# Patient Record
Sex: Male | Born: 1985 | Race: Black or African American | Hispanic: No | Marital: Single | Smoking: Never smoker
Health system: Southern US, Community
[De-identification: ages and names within clinical notes are randomized; demographics above are authoritative.]

---

## 2015-08-28 ENCOUNTER — Emergency Department (HOSPITAL_COMMUNITY)
Admission: EM | Admit: 2015-08-28 | Discharge: 2015-08-28 | Disposition: A | Discharge status: Home / Self Care | Payer: Self-pay | Attending: Emergency Medicine | Admitting: Emergency Medicine

## 2015-08-28 ENCOUNTER — Encounter (HOSPITAL_COMMUNITY): Payer: Self-pay | Admitting: *Deleted

## 2015-08-28 DIAGNOSIS — M5489 Other dorsalgia: Secondary | ICD-10-CM

## 2015-08-28 DIAGNOSIS — Y92410 Unspecified street and highway as the place of occurrence of the external cause: Secondary | ICD-10-CM | POA: Insufficient documentation

## 2015-08-28 DIAGNOSIS — S4991XA Unspecified injury of right shoulder and upper arm, initial encounter: Secondary | ICD-10-CM | POA: Insufficient documentation

## 2015-08-28 DIAGNOSIS — S3992XA Unspecified injury of lower back, initial encounter: Secondary | ICD-10-CM | POA: Insufficient documentation

## 2015-08-28 DIAGNOSIS — Y9389 Activity, other specified: Secondary | ICD-10-CM | POA: Insufficient documentation

## 2015-08-28 DIAGNOSIS — S299XXA Unspecified injury of thorax, initial encounter: Secondary | ICD-10-CM | POA: Insufficient documentation

## 2015-08-28 DIAGNOSIS — S199XXA Unspecified injury of neck, initial encounter: Secondary | ICD-10-CM | POA: Insufficient documentation

## 2015-08-28 DIAGNOSIS — Y998 Other external cause status: Secondary | ICD-10-CM | POA: Insufficient documentation

## 2015-08-28 DIAGNOSIS — S4992XA Unspecified injury of left shoulder and upper arm, initial encounter: Secondary | ICD-10-CM | POA: Insufficient documentation

## 2015-08-28 MED ORDER — IBUPROFEN 800 MG PO TABS: 800.0000 mg | ORAL_TABLET | Freq: Three times a day (TID) | ORAL | Status: AC

## 2015-08-28 MED ORDER — METHOCARBAMOL 500 MG PO TABS: 500.0000 mg | ORAL_TABLET | Freq: Two times a day (BID) | ORAL | Status: AC

## 2015-08-28 NOTE — ED Notes (Signed)
Pt stable, ambulatory, states understanding of discharge instructions 
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
## 2015-08-28 NOTE — Discharge Instructions (Signed)
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
## 2015-09-01 NOTE — ED Notes (Signed)
Pt came to ED, needs work note to return to work. Nurse printed work note.

## 2015-09-08 ENCOUNTER — Emergency Department (HOSPITAL_COMMUNITY)
Admission: EM | Admit: 2015-09-08 | Discharge: 2015-09-08 | Disposition: A | Discharge status: Home / Self Care | Payer: Self-pay | Attending: Emergency Medicine | Admitting: Emergency Medicine

## 2015-09-08 ENCOUNTER — Encounter (HOSPITAL_COMMUNITY): Payer: Self-pay | Admitting: Emergency Medicine

## 2015-09-08 ENCOUNTER — Emergency Department (HOSPITAL_COMMUNITY): Payer: Self-pay

## 2015-09-08 DIAGNOSIS — M542 Cervicalgia: Secondary | ICD-10-CM | POA: Insufficient documentation

## 2015-09-08 DIAGNOSIS — M545 Low back pain: Secondary | ICD-10-CM | POA: Insufficient documentation

## 2015-09-08 MED ORDER — MELOXICAM 7.5 MG PO TABS: 7.5000 mg | ORAL_TABLET | Freq: Every day | ORAL | Status: AC
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
## 2015-09-08 NOTE — Discharge Instructions (Signed)
Cervical Sprain °A cervical sprain is an injury in the neck in which the strong, fibrous tissues (ligaments) that connect your neck bones stretch or tear. Cervical sprains can range from mild to severe. Severe cervical sprains can cause the neck vertebrae to be unstable. This can lead to damage of the spinal cord and can result in serious nervous system problems. The amount of time it takes for a cervical sprain to get better depends on the cause and extent of the injury. Most cervical sprains heal in 1 to 3 weeks. °CAUSES  °Severe cervical sprains may be caused by:  °· Contact sport injuries (such as from football, rugby, wrestling, hockey, auto racing, gymnastics, diving, martial arts, or boxing).   °· Motor vehicle collisions.   °· Whiplash injuries. This is an injury from a sudden forward and backward whipping movement of the head and neck.  °· Falls.   °Mild cervical sprains may be caused by:  °· Being in an awkward position, such as while cradling a telephone between your ear and shoulder.   °· Sitting in a chair that does not offer proper support.   °· Working at a poorly designed computer station.   °· Looking up or down for long periods of time.   °SYMPTOMS  °· Pain, soreness, stiffness, or a burning sensation in the front, back, or sides of the neck. This discomfort may develop immediately after the injury or slowly, 24 hours or more after the injury.   °· Pain or tenderness directly in the middle of the back of the neck.   °· Shoulder or upper back pain.   °· Limited ability to move the neck.   °· Headache.   °· Dizziness.   °· Weakness, numbness, or tingling in the hands or arms.   °· Muscle spasms.   °· Difficulty swallowing or chewing.   °· Tenderness and swelling of the neck.   °DIAGNOSIS  °Most of the time your health care provider can diagnose a cervical sprain by taking your history and doing a physical exam. Your health care provider will ask about previous neck injuries and any known neck  problems, such as arthritis in the neck. X-rays may be taken to find out if there are any other problems, such as with the bones of the neck. Other tests, such as a CT scan or MRI, may also be needed.  °TREATMENT  °Treatment depends on the severity of the cervical sprain. Mild sprains can be treated with rest, keeping the neck in place (immobilization), and pain medicines. Severe cervical sprains are immediately immobilized. Further treatment is done to help with pain, muscle spasms, and other symptoms and may include: °· Medicines, such as pain relievers, numbing medicines, or muscle relaxants.   °· Physical therapy. This may involve stretching exercises, strengthening exercises, and posture training. Exercises and improved posture can help stabilize the neck, strengthen muscles, and help stop symptoms from returning.   °HOME CARE INSTRUCTIONS  °· Put ice on the injured area.   °¨ Put ice in a plastic bag.   °¨ Place a towel between your skin and the bag.   °¨ Leave the ice on for 15-20 minutes, 3-4 times a day.   °· If your injury was severe, you may have been given a cervical collar to wear. A cervical collar is a two-piece collar designed to keep your neck from moving while it heals. °¨ Do not remove the collar unless instructed by your health care provider. °¨ If you have long hair, keep it outside of the collar. °¨ Ask your health care provider before making any adjustments to your collar. Minor   adjustments may be required over time to improve comfort and reduce pressure on your chin or on the back of your head.  Ifyou are allowed to remove the collar for cleaning or bathing, follow your health care provider's instructions on how to do so safely.  Keep your collar clean by wiping it with mild soap and water and drying it completely. If the collar you have been given includes removable pads, remove them every 1-2 days and hand wash them with soap and water. Allow them to air dry. They should be completely  dry before you wear them in the collar.  If you are allowed to remove the collar for cleaning and bathing, wash and dry the skin of your neck. Check your skin for irritation or sores. If you see any, tell your health care provider.  Do not drive while wearing the collar.   Only take over-the-counter or prescription medicines for pain, discomfort, or fever as directed by your health care provider.   Keep all follow-up appointments as directed by your health care provider.   Keep all physical therapy appointments as directed by your health care provider.   Make any needed adjustments to your workstation to promote good posture.   Avoid positions and activities that make your symptoms worse.   Warm up and stretch before being active to help prevent problems.  SEEK MEDICAL CARE IF:   Your pain is not controlled with medicine.   You are unable to decrease your pain medicine over time as planned.   Your activity level is not improving as expected.  SEEK IMMEDIATE MEDICAL CARE IF:   You develop any bleeding.  You develop stomach upset.  You have signs of an allergic reaction to your medicine.   Your symptoms get worse.   You develop new, unexplained symptoms.   You have numbness, tingling, weakness, or paralysis in any part of your body.  MAKE SURE YOU:   Understand these instructions.  Will watch your condition.  Will get help right away if you are not doing well or get worse. Document Released: 10/09/2007 Document Revised: 12/17/2013 Document Reviewed: 06/19/2013 [facility] Patient Information 2015 [place], [place]. This information is not intended to replace advice given to you by your health care provider. Make sure you discuss any questions you have with your health care provider. Back Pain, Adult Low back pain is very common. About 1 in 5 people have back pain.The cause of low back pain is rarely dangerous. The pain often gets better over time.About half of  people with a sudden onset of back pain feel better in just 2 weeks. About 8 in 10 people feel better by 6 weeks.  CAUSES Some common causes of back pain include:  Strain of the muscles or ligaments supporting the spine.  Wear and tear (degeneration) of the spinal discs.  Arthritis.  Direct injury to the back. DIAGNOSIS Most of the time, the direct cause of low back pain is not known.However, back pain can be treated effectively even when the exact cause of the pain is unknown.Answering your caregiver's questions about your overall health and symptoms is one of the most accurate ways to make sure the cause of your pain is not dangerous. If your caregiver needs more information, he or she may order lab work or imaging tests (X-rays or MRIs).However, even if imaging tests show changes in your back, this usually does not require surgery. HOME CARE INSTRUCTIONS For many people, back pain returns.Since low back pain is  rarely dangerous, it is often a condition that people can learn to [facility] their own.   Remain active. It is stressful on the back to sit or stand in one place. Do not sit, drive, or stand in one place for more than 30 minutes at a time. Take short walks on level surfaces as soon as pain allows.Try to increase the length of time you walk each day.  Do not stay in bed.Resting more than 1 or 2 days can delay your recovery.  Do not avoid exercise or work.Your body is made to move.It is not dangerous to be active, even though your back may hurt.Your back will likely heal faster if you return to being active before your pain is gone.  Pay attention to your body when you bend and lift. Many people have less discomfortwhen lifting if they bend their knees, keep the load close to their bodies,and avoid twisting. Often, the most comfortable positions are those that put less stress on your recovering back.  Find a comfortable position to sleep. Use a firm mattress and lie on  your side with your knees slightly bent. If you lie on your back, put a pillow under your knees.  Only take over-the-counter or prescription medicines as directed by your caregiver. Over-the-counter medicines to reduce pain and inflammation are often the most helpful.Your caregiver may prescribe muscle relaxant drugs.These medicines help dull your pain so you can more quickly return to your normal activities and healthy exercise.  Put ice on the injured area.  Put ice in a plastic bag.  Place a towel between your skin and the bag.  Leave the ice on for 15-20 minutes, 03-04 times a day for the first 2 to 3 days. After that, ice and heat may be alternated to reduce pain and spasms.  Ask your caregiver about trying back exercises and gentle massage. This may be of some benefit.  Avoid feeling anxious or stressed.Stress increases muscle tension and can worsen back pain.It is important to recognize when you are anxious or stressed and learn ways to manage it.Exercise is a great option. SEEK MEDICAL CARE IF:  You have pain that is not relieved with rest or medicine.  You have pain that does not improve in 1 week.  You have new symptoms.  You are generally not feeling well. SEEK IMMEDIATE MEDICAL CARE IF:   You have pain that radiates from your back into your legs.  You develop new bowel or bladder control problems.  You have unusual weakness or numbness in your arms or legs.  You develop nausea or vomiting.  You develop abdominal pain.  You feel faint. Document Released: 12/12/2005 Document Revised: 06/12/2012 Document Reviewed: 04/15/2014 [facility] Patient Information 2015 [place], [place]. This information is not intended to replace advice given to you by your health care provider. Make sure you discuss any questions you have with your health care provider.

## 2015-09-08 NOTE — ED Notes (Signed)
Pt ambulatory without any problems 
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
## 2017-12-12 ENCOUNTER — Encounter (HOSPITAL_BASED_OUTPATIENT_CLINIC_OR_DEPARTMENT_OTHER): Payer: Self-pay | Admitting: Emergency Medicine

## 2017-12-12 ENCOUNTER — Emergency Department (HOSPITAL_BASED_OUTPATIENT_CLINIC_OR_DEPARTMENT_OTHER)
Admission: EM | Admit: 2017-12-12 | Discharge: 2017-12-12 | Disposition: A | Discharge status: Home / Self Care | Payer: Self-pay | Attending: Emergency Medicine | Admitting: Emergency Medicine

## 2017-12-12 ENCOUNTER — Other Ambulatory Visit: Payer: Self-pay

## 2017-12-12 DIAGNOSIS — Z791 Long term (current) use of non-steroidal anti-inflammatories (NSAID): Secondary | ICD-10-CM | POA: Insufficient documentation

## 2017-12-12 DIAGNOSIS — Z79899 Other long term (current) drug therapy: Secondary | ICD-10-CM | POA: Insufficient documentation

## 2017-12-12 DIAGNOSIS — M546 Pain in thoracic spine: Secondary | ICD-10-CM | POA: Insufficient documentation

## 2017-12-12 LAB — URINALYSIS, ROUTINE W REFLEX MICROSCOPIC
BILIRUBIN URINE: NEGATIVE
Glucose, UA: NEGATIVE mg/dL
Hgb urine dipstick: NEGATIVE
Ketones, ur: NEGATIVE mg/dL
LEUKOCYTES UA: NEGATIVE
NITRITE: NEGATIVE
PH: 7 (ref 5.0–8.0)
Protein, ur: NEGATIVE mg/dL
SPECIFIC GRAVITY, URINE: 1.025 (ref 1.005–1.030)

## 2017-12-12 MED ORDER — CYCLOBENZAPRINE HCL 5 MG PO TABS: 5.0000 mg | ORAL_TABLET | Freq: Every evening | ORAL | 0 refills | Status: AC | PRN

## 2017-12-12 MED ORDER — NAPROXEN 500 MG PO TABS: 500.0000 mg | ORAL_TABLET | Freq: Two times a day (BID) | ORAL | 0 refills | Status: AC

## 2017-12-12 NOTE — ED Triage Notes (Signed)
Patient states that he has had back pain since " this summer" - today it hurt worse and he was unable to go work because of the pain

## 2017-12-12 NOTE — Discharge Instructions (Signed)
Take naproxen 2 times a day with meals for the next 7 days.  Do not take other anti-inflammatories at the same time open (Advil, Motrin, ibuprofen, Aleve). You may supplement with Tylenol if you need further pain control. Use ice packs or heating pads if this helps control your pain. Take flexeril as needed at night for muscle soreness. Have caution, as this can make you tired or groggy. Do not drive or operate heavy machinery while taking this.  Continue to use the back brace while at work.  Follow up with [facility] for further evaluation and management of your back.  Return to the ER if you develop fevers, loss of bowel or bladder control, numbness, or any new or worsening symptoms.

## 2017-12-12 NOTE — ED Provider Notes (Signed)
[facility] EMERGENCY DEPARTMENT Provider Note   CSN: [id number] Arrival date & time: 12/12/17  1712     History   Chief Complaint Chief Complaint  Patient presents with  . Back Pain    HPI Brian Burton is a 31 y.o. male presenting with back pain.  Patient states he has had back pain since the end of the summer when he fell off a roof.  Back pain is of the mid thoracic area.  It is constant, worse with lifting and movement.  He has been using heat and stretching, which mildly improves the pain.  He wears a back brace at work, as he does a lot of lifting, and this improves his symptoms.  He has not tried any medicines.  He denies fevers, neck pain, numbness, tingling, loss of bowel or bladder control, history of IV drug use, or history of cancer.  He reports the pain is worse in the right side, and when he is lifting, he often lifts with the right side. The pain is described as an ache.   HPI  History reviewed. No pertinent past medical history.  There are no active problems to display for this patient.   History reviewed. No pertinent surgical history.     Home Medications    Prior to Admission medications   Medication Sig Start Date End Date Taking? Authorizing Provider  cyclobenzaprine (FLEXERIL) 5 MG tablet Take 1 tablet (5 mg total) by mouth at bedtime as needed for muscle spasms. 12/12/17   Emonni Depasquale, PA-C  ibuprofen (ADVIL,MOTRIN) 800 MG tablet Take 1 tablet (800 mg total) by mouth 3 (three) times daily. 08/28/15   Elson AreasSofia, Leslie K, PA-C  meloxicam (MOBIC) 7.5 MG tablet Take 1 tablet (7.5 mg total) by mouth daily. 09/08/15   Elson AreasSofia, Leslie K, PA-C  methocarbamol (ROBAXIN) 500 MG tablet Take 1 tablet (500 mg total) by mouth 2 (two) times daily. Patient not taking: Reported on 09/08/2015 08/28/15   Elson AreasSofia, Leslie K, PA-C  naproxen (NAPROSYN) 500 MG tablet Take 1 tablet (500 mg total) by mouth 2 (two) times daily with a meal. 12/12/17   Asta Corbridge, PA-C      Family History History reviewed. No pertinent family history.  Social History Social History   Tobacco Use  . Smoking status: Never Smoker  . Smokeless tobacco: Never Used  Substance Use Topics  . Alcohol use: No  . Drug use: No     Allergies   Patient has no known allergies.   Review of Systems Review of Systems  Musculoskeletal: Positive for back pain. Negative for gait problem.  Neurological: Negative for numbness and headaches.     Physical Exam Updated Vital Signs BP (!) 144/74 (BP Location: Left Arm)   Pulse 62   Temp 98.3 F (36.8 C) (Oral)   Resp 18   Ht 5\' 6"  (1.676 m)   Wt 74.8 kg (165 lb)   SpO2 99%   BMI 26.63 kg/m   Physical Exam  Constitutional: He is oriented to person, place, and time. He appears well-developed and well-nourished. No distress.  HENT:  Head: Normocephalic and atraumatic.  Eyes: EOM are normal.  Neck: Normal range of motion.  Cardiovascular: Normal rate, regular rhythm and intact distal pulses.  Pulmonary/Chest: Effort normal and breath sounds normal. No respiratory distress. He has no wheezes.  Abdominal: Soft. He exhibits no distension. There is no tenderness.  Musculoskeletal: Normal range of motion.  Tenderness to palpation of right-sided back musculature and  midline spine from T4 to L5.  No tenderness palpation on the left side.  No palpable deformities.  No tenderness palpation of the ribs.  Patient is ambulatory without difficulty.  Strength intact x4.  Sensation intact x4.  Neurological: He is alert and oriented to person, place, and time.  Skin: Skin is warm. No rash noted.  Psychiatric: He has a normal mood and affect.  Nursing note and vitals reviewed.    ED Treatments / Results  Labs (all labs ordered are listed, but only abnormal results are displayed) Labs Reviewed  URINALYSIS, ROUTINE W REFLEX MICROSCOPIC    EKG  EKG Interpretation None       Radiology No results  found.  Procedures Procedures (including critical care time)  Medications Ordered in ED Medications - No data to display   Initial Impression / Assessment and Plan / ED Course  I have reviewed the triage vital signs and the nursing notes.  Pertinent labs & imaging results that were available during my care of the patient were reviewed by me and considered in my medical decision making (see chart for details).     Patient presenting with a 5320-month history of back pain, worsening today.  Physical exam reassuring, no obvious neurologic deficits.  Tenderness palpation of right-sided back musculature and midline spine. As spine tenderness is present for majority of back,m doubt fx or localized spinal cord abnormality.  At this time, doubt spinal cord compression or myelopathy including cauda equina syndrome.  Pt does not want back xrays today. Likely muscular pain.  Will treat with NSAIDs and muscle relaxer.  Patient given information for orthopedics if pain is not improved.  At this time, patient present for discharge.  Return precautions given.  Patient states he understands and agrees to plan.   Final Clinical Impressions(s) / ED Diagnoses   Final diagnoses:  Right-sided thoracic back pain, unspecified chronicity    ED Discharge Orders        Ordered    naproxen (NAPROSYN) 500 MG tablet  2 times daily with meals     12/12/17 2047    cyclobenzaprine (FLEXERIL) 5 MG tablet  At bedtime PRN     12/12/17 2047       Alveria ApleyCaccavale, Dayshon Roback, PA-C 12/13/17 0134    Melene PlanFloyd, Dan, DO 12/14/17 1534
